# Patient Record
Sex: Female | Born: 1960 | Race: White | Hispanic: No | Marital: Married | Smoking: Never smoker
Health system: Southern US, Community
[De-identification: ages and names within clinical notes are randomized; demographics above are authoritative.]

## PROBLEM LIST (undated history)

## (undated) DIAGNOSIS — F419 Anxiety disorder, unspecified: Secondary | ICD-10-CM

## (undated) DIAGNOSIS — F329 Major depressive disorder, single episode, unspecified: Secondary | ICD-10-CM

## (undated) DIAGNOSIS — E079 Disorder of thyroid, unspecified: Secondary | ICD-10-CM

---

## 2020-02-25 ENCOUNTER — Encounter (HOSPITAL_COMMUNITY): Payer: Self-pay

## 2020-02-25 ENCOUNTER — Ambulatory Visit (HOSPITAL_COMMUNITY)
Admission: EM | Admit: 2020-02-25 | Discharge: 2020-02-25 | Disposition: A | Discharge status: Home / Self Care | Payer: Managed Care, Other (non HMO)

## 2020-02-25 ENCOUNTER — Other Ambulatory Visit: Payer: Self-pay

## 2020-02-25 DIAGNOSIS — U071 COVID-19: Secondary | ICD-10-CM | POA: Diagnosis not present

## 2020-02-25 DIAGNOSIS — R5383 Other fatigue: Secondary | ICD-10-CM

## 2020-02-25 DIAGNOSIS — R0789 Other chest pain: Secondary | ICD-10-CM

## 2020-02-25 HISTORY — DX: Major depressive disorder, single episode, unspecified: F32.9

## 2020-02-25 HISTORY — DX: Disorder of thyroid, unspecified: E07.9

## 2020-02-25 HISTORY — DX: Anxiety disorder, unspecified: F41.9

## 2020-02-25 MED ORDER — BENZONATATE 100 MG PO CAPS: 100.0000 mg | ORAL_CAPSULE | Freq: Three times a day (TID) | ORAL | 0 refills | Status: AC | PRN

## 2020-02-25 MED ORDER — PROMETHAZINE-DM 6.25-15 MG/5ML PO SYRP: 5.0000 mL | ORAL_SOLUTION | Freq: Every evening | ORAL | 0 refills | Status: AC | PRN

## 2020-02-25 MED ORDER — CETIRIZINE HCL 10 MG PO TABS: 10.0000 mg | ORAL_TABLET | Freq: Every day | ORAL | 0 refills | Status: AC

## 2020-02-25 MED ORDER — PSEUDOEPHEDRINE HCL 30 MG PO TABS: 30.0000 mg | ORAL_TABLET | Freq: Three times a day (TID) | ORAL | 0 refills | Status: AC | PRN

## 2020-02-25 NOTE — ED Triage Notes (Signed)
Pt c/o non-productive cough, sore throat onset Wednesday-now reports resolved. States she tested positive for COVID yesterday. Now reports fatigue, mild diarrhea, mild lightheadedness, runny nose, chills, nausea. Pt visiting the area and is requesting referral to COVID tx/infusion clinic. Denies SOB, vomiting.

## 2020-02-25 NOTE — ED Provider Notes (Signed)
[facility]   MRN: [id number] DOB: 11/02/1960  Subjective:   Dawn Rosario is a 59 y.o. female presenting for 2-day history of acute onset productive cough, sore throat, fatigue, mild diarrhea, mild lightheadedness, runny nose, chills, nausea.  Today, patient states that her primary symptom is fatigue and chest heaviness.  She is from [place], her doctor advised to take supportive medications.  [facility] and tested positive for COVID-19, can be seen on care everywhere.  No current facility-administered medications for this encounter.  Current Outpatient Medications:  .  DULoxetine (CYMBALTA) 30 MG capsule, Take 30 mg by mouth daily., Disp: , Rfl:  .  DULoxetine (CYMBALTA) 60 MG capsule, Take by mouth., Disp: , Rfl:  .  gabapentin (NEURONTIN) 300 MG capsule, Take 300 mg by mouth 3 (three) times daily., Disp: , Rfl:  .  levothyroxine (SYNTHROID) 75 MCG tablet, , Disp: , Rfl:  .  promethazine (PHENERGAN) 12.5 MG tablet, Take by mouth., Disp: , Rfl:  .  traZODone (DESYREL) 50 MG tablet, Take by mouth., Disp: , Rfl:  .  ondansetron (ZOFRAN-ODT) 4 MG disintegrating tablet, Take by mouth., Disp: , Rfl:  .  SYNTHROID 100 MCG tablet, Take 100 mcg by mouth daily., Disp: , Rfl:    Allergies  Allergen Reactions  . Sulfa Antibiotics Diarrhea and Other (See Comments)    Mood changes per patient      Past Medical History:  Diagnosis Date  . Anxiety   . Major depressive disorder   . Thyroid disease      History reviewed. No pertinent surgical history.  Family History  Problem Relation Age of Onset  . Cancer Mother   . Cancer Father     Social History   Tobacco Use  . Smoking status: Never Smoker  . Smokeless tobacco: Never Used  Vaping Use  . Vaping Use: Never used  Substance Use Topics  . Alcohol use: Yes  . Drug use: Never    ROS   Objective:   Vitals: BP 134/88 (BP Location: Right Arm)   Pulse 92   Temp 98.7 F (37.1 C)  (Oral)   Resp 18   SpO2 98%   Physical Exam Constitutional:      General: She is not in acute distress.    Appearance: Normal appearance. She is well-developed. She is not ill-appearing, toxic-appearing or diaphoretic.  HENT:     Head: Normocephalic and atraumatic.     Nose: Nose normal.     Mouth/Throat:     Mouth: Mucous membranes are moist.  Eyes:     Extraocular Movements: Extraocular movements intact.     Pupils: Pupils are equal, round, and reactive to light.  Cardiovascular:     Rate and Rhythm: Normal rate and regular rhythm.     Pulses: Normal pulses.     Heart sounds: Normal heart sounds. No murmur heard.  No friction rub. No gallop.   Pulmonary:     Effort: Pulmonary effort is normal. No respiratory distress.     Breath sounds: Normal breath sounds. No stridor. No wheezing, rhonchi or rales.  Skin:    General: Skin is warm and dry.     Findings: No rash.  Neurological:     Mental Status: She is alert and oriented to person, place, and time.  Psychiatric:        Mood and Affect: Mood normal.        Behavior: Behavior normal.  Thought Content: Thought content normal.      Assessment and Plan :   PDMP not reviewed this encounter.  1. COVID-19   2. Fatigue, unspecified type   3. Chest heaviness     Recommended supportive care for COVID-19.  Patient requested referral to the infusion clinic and I placed this through the [facility] [employer] [facility] secure chat.  Also provided her with information to the clinic itself. Counseled patient on potential for adverse effects with medications prescribed/recommended today, ER and return-to-clinic precautions discussed, patient verbalized understanding.    Wallis Bamberg, PA-C 02/25/20 1810

## 2020-02-25 NOTE — Discharge Instructions (Addendum)
Outpatient Infusion Center hotline number, [phone] [address], [place], [place] [address]

## 2020-02-26 ENCOUNTER — Telehealth: Payer: Self-pay | Admitting: Nurse Practitioner

## 2020-02-26 ENCOUNTER — Telehealth: Payer: Self-pay | Admitting: Physician Assistant

## 2020-02-26 ENCOUNTER — Other Ambulatory Visit: Payer: Self-pay | Admitting: Physician Assistant

## 2020-02-26 NOTE — Telephone Encounter (Signed)
Called to discuss with Fatima Sanger about Covid symptoms and the use of casirivimab/imdevimab, a monoclonal antibody infusion for those with mild to moderate Covid symptoms and at a high risk of hospitalization.    Pt is not qualified for this infusion due to lack of identified risk factors and co-morbid conditions.  Symptoms reviewed as well as criteria for ending isolation.  Symptoms reviewed that would warrant ED/Hospital evaluation as well should her condition worsen. Preventative practices reviewed. Provided COVID Care Clinic phone number. Patient verbalized understanding.  Wynne Dust, NP

## 2020-02-26 NOTE — Progress Notes (Signed)
error 

## 2020-02-26 NOTE — Telephone Encounter (Signed)
error 

## 2020-02-28 ENCOUNTER — Other Ambulatory Visit: Payer: Self-pay

## 2020-02-28 ENCOUNTER — Emergency Department (HOSPITAL_COMMUNITY)
Admission: EM | Admit: 2020-02-28 | Discharge: 2020-02-28 | Disposition: A | Discharge status: Home / Self Care | Payer: Managed Care, Other (non HMO) | Attending: Emergency Medicine | Admitting: Emergency Medicine

## 2020-02-28 ENCOUNTER — Encounter (HOSPITAL_COMMUNITY): Payer: Self-pay

## 2020-02-28 ENCOUNTER — Emergency Department (HOSPITAL_COMMUNITY): Payer: Managed Care, Other (non HMO)

## 2020-02-28 DIAGNOSIS — R002 Palpitations: Secondary | ICD-10-CM | POA: Diagnosis not present

## 2020-02-28 DIAGNOSIS — E039 Hypothyroidism, unspecified: Secondary | ICD-10-CM | POA: Insufficient documentation

## 2020-02-28 DIAGNOSIS — U071 COVID-19: Secondary | ICD-10-CM | POA: Diagnosis not present

## 2020-02-28 DIAGNOSIS — R5383 Other fatigue: Secondary | ICD-10-CM | POA: Diagnosis present

## 2020-02-28 DIAGNOSIS — Z79890 Hormone replacement therapy (postmenopausal): Secondary | ICD-10-CM | POA: Insufficient documentation

## 2020-02-28 LAB — BASIC METABOLIC PANEL
Anion gap: 14 (ref 5–15)
BUN: 6 mg/dL (ref 6–20)
CO2: 28 mmol/L (ref 22–32)
Calcium: 9.4 mg/dL (ref 8.9–10.3)
Chloride: 97 mmol/L — ABNORMAL LOW (ref 98–111)
Creatinine, Ser: 0.62 mg/dL (ref 0.44–1.00)
GFR calc Af Amer: >60 mL/min (ref >60)
GFR calc non Af Amer: >60 mL/min (ref >60)
Glucose, Bld: 101 mg/dL — ABNORMAL HIGH (ref 70–99)
Potassium: 4.8 mmol/L (ref 3.5–5.1)
Sodium: 139 mmol/L (ref 135–145)

## 2020-02-28 LAB — CBC
HCT: 37.1 % (ref 36.0–46.0)
Hemoglobin: 12.1 g/dL (ref 12.0–15.0)
MCH: 29.3 pg (ref 26.0–34.0)
MCHC: 32.6 g/dL (ref 30.0–36.0)
MCV: 89.8 fL (ref 80.0–100.0)
Platelets: 130 10*3/uL — ABNORMAL LOW (ref 150–400)
RBC: 4.13 MIL/uL (ref 3.87–5.11)
RDW: 13.8 % (ref 11.5–15.5)
WBC: 2.3 10*3/uL — ABNORMAL LOW (ref 4.0–10.5)
nRBC: 0 % (ref 0.0–0.2)

## 2020-02-28 LAB — TROPONIN I (HIGH SENSITIVITY)
Troponin I (High Sensitivity): 2 ng/L (ref <18)
Troponin I (High Sensitivity): 2 ng/L (ref <18)

## 2020-02-28 LAB — TSH: TSH: 2.76 u[IU]/mL (ref 0.350–4.500)

## 2020-02-28 LAB — MAGNESIUM: Magnesium: 2.2 mg/dL (ref 1.7–2.4)

## 2020-02-28 NOTE — ED Provider Notes (Signed)
[facility]-EMERGENCY DEPT Provider Note   CSN: [id number] Arrival date & time: 02/28/20  1149     History Chief Complaint  Patient presents with  . Covid Positive  . Palpitations  . Fatigue    Dawn Rosario is a 59 y.o. female.  Dawn Rosario is a 59 year old woman with a PMH of major depression and hypothyroidism who presents with a chief complaint of heart palpitations and fatigue with covid positive test on 09/09. Patient's symptoms started on 09/08 including fatigue, chills, headache, productive cough, and nasal congestion. She states that her symptoms have been waxing and waning and her symptoms are worse at different times during the day. She is now experiencing palpitations and chest heaviness that is worst when she is fatigued. She rates the chest heaviness as a 4/10 at its worst and pinpoints it to the substernal area. She denies SOB or hemoptysis. She states that she has had chest palpitations on and off for the last year but has never been evaluated for it. She also reports that this chest heaviness and palpitations always occur when she has any kind of illness like the flu or a cold. She is taking Vit C, Vit D, zinc, and saline nasal spray for supportive care. She is tolerating liquids and solids but does not have much of an appetite. She denies current nausea, vomiting, abdominal pain, or diarrhea.  Patient did not receive her Covid vaccine.  She was evaluated over the phone for monoclonal antibody infusion after receiving her positive results, but does not meet criteria.        Past Medical History:  Diagnosis Date  . Anxiety   . Major depressive disorder   . Thyroid disease     There are no problems to display for this patient.   History reviewed. No pertinent surgical history.   OB History   No obstetric history on file.     Family History  Problem Relation Age of Onset  . Cancer Mother   . Cancer Father     Social History    Tobacco Use  . Smoking status: Never Smoker  . Smokeless tobacco: Never Used  Vaping Use  . Vaping Use: Never used  Substance Use Topics  . Alcohol use: Yes  . Drug use: Never    Home Medications Prior to Admission medications   Medication Sig Start Date End Date Taking? Authorizing Provider  benzonatate (TESSALON) 100 MG capsule Take 1-2 capsules (100-200 mg total) by mouth 3 (three) times daily as needed. 02/25/20   Wallis Bamberg, PA-C  cetirizine (ZYRTEC ALLERGY) 10 MG tablet Take 1 tablet (10 mg total) by mouth daily. 02/25/20   Wallis Bamberg, PA-C  DULoxetine (CYMBALTA) 30 MG capsule Take 30 mg by mouth daily. 12/30/19   [provider]  DULoxetine (CYMBALTA) 60 MG capsule Take by mouth. 12/30/19   [provider]  gabapentin (NEURONTIN) 300 MG capsule Take 300 mg by mouth 3 (three) times daily. 01/25/20   [provider]  levothyroxine (SYNTHROID) 75 MCG tablet  12/22/19   [provider]  ondansetron (ZOFRAN-ODT) 4 MG disintegrating tablet Take by mouth.    [provider]  promethazine (PHENERGAN) 12.5 MG tablet Take by mouth. 02/25/20 02/29/20  [provider]  promethazine-dextromethorphan (PROMETHAZINE-DM) 6.25-15 MG/5ML syrup Take 5 mLs by mouth at bedtime as needed for cough. 02/25/20   Wallis Bamberg, PA-C  pseudoephedrine (SUDAFED) 30 MG tablet Take 1 tablet (30 mg total) by mouth every 8 (  eight) hours as needed for congestion. 02/25/20   Wallis BambergMani, Mario, PA-C  SYNTHROID 100 MCG tablet Take 100 mcg by mouth daily. 09/13/19   [provider]  traZODone (DESYREL) 50 MG tablet Take by mouth. 12/30/19   [provider]    Allergies    Sulfa antibiotics  Review of Systems   Review of Systems  Constitutional: Positive for appetite change, chills and fatigue. Negative for fever.  HENT: Positive for congestion.   Respiratory: Positive for cough and chest tightness. Negative for shortness of breath.   Cardiovascular:  Positive for chest pain and palpitations. Negative for leg swelling.  Gastrointestinal: Negative for abdominal pain, nausea and vomiting.  Genitourinary: Negative for dysuria.  Musculoskeletal: Negative for arthralgias and myalgias.  Skin: Negative for color change and rash.  Neurological: Positive for headaches.  All other systems reviewed and are negative.   Physical Exam Updated Vital Signs BP 121/84 (BP Location: Left Arm)   Pulse 94   Temp 99.2 F (37.3 C) (Oral)   Resp 16   Ht 5' 2.5" (1.588 m)   Wt 49.4 kg   SpO2 97%   BMI 19.62 kg/m   Physical Exam Vitals and nursing note reviewed.  Constitutional:      General: She is not in acute distress.    Appearance: Normal appearance. She is well-developed and normal weight. She is not ill-appearing or diaphoretic.  HENT:     Head: Normocephalic and atraumatic.     Mouth/Throat:     Mouth: Mucous membranes are moist.     Pharynx: Oropharynx is clear.  Eyes:     General:        Right eye: No discharge.        Left eye: No discharge.     Pupils: Pupils are equal, round, and reactive to light.  Cardiovascular:     Rate and Rhythm: Normal rate and regular rhythm.     Heart sounds: Normal heart sounds.     Comments: Normal rate and regular rhythm, normal S1 and S2 present, no audible PVCs, distal pulses intact and equal bilaterally Pulmonary:     Effort: Pulmonary effort is normal. No respiratory distress.     Breath sounds: Normal breath sounds. No wheezing or rales.     Comments: Respirations equal and unlabored, patient able to speak in full sentences, lungs clear to auscultation bilaterally Abdominal:     General: Bowel sounds are normal. There is no distension.     Palpations: Abdomen is soft. There is no mass.     Tenderness: There is no abdominal tenderness. There is no guarding.     Comments: Abdomen soft, nondistended, nontender to palpation in all quadrants without guarding or peritoneal signs  Musculoskeletal:         General: No deformity.     Cervical back: Neck supple.     Right lower leg: No edema.     Left lower leg: No edema.  Skin:    General: Skin is warm and dry.     Capillary Refill: Capillary refill takes less than 2 seconds.  Neurological:     Mental Status: She is alert.     Coordination: Coordination normal.     Comments: Speech is clear, able to follow commands Moves extremities without ataxia, coordination intact  Psychiatric:        Mood and Affect: Mood normal.        Behavior: Behavior normal.     ED Results / Procedures / Treatments  Labs (all labs ordered are listed, but only abnormal results are displayed) Labs Reviewed  BASIC METABOLIC PANEL - Abnormal; Notable for the following components:      Result Value   Chloride 97 (*)    Glucose, Bld 101 (*)    All other components within normal limits  CBC - Abnormal; Notable for the following components:   WBC 2.3 (*)    Platelets 130 (*)    All other components within normal limits  MAGNESIUM  TSH  TROPONIN I (HIGH SENSITIVITY)  TROPONIN I (HIGH SENSITIVITY)    EKG ED ECG REPORT   Date: 02/28/2020  Rate: 85  Rhythm: normal sinus rhythm  QRS Axis: normal  Intervals: normal  ST/T Wave abnormalities: normal  Conduction Disutrbances:none  Narrative Interpretation: Bi-atrial enlargement, borderline right axis deviation  Old EKG Reviewed: none available  I have personally reviewed the EKG tracing and agree with the computerized printout as noted.   Radiology DG Chest 2 View  Result Date: 02/28/2020 CLINICAL DATA:  Palpitations EXAM: CHEST - 2 VIEW COMPARISON:  None. FINDINGS: COPD with pulmonary hyperinflation. Lungs are clear without infiltrate or effusion. Negative for heart failure or mass lesion. Mild apical scarring bilaterally. IMPRESSION: COPD without acute abnormality. Electronically Signed   By: Marlan Palau M.D.   On: 02/28/2020 13:47    Procedures Procedures (including critical care  time)  Medications Ordered in ED Medications - No data to display  ED Course  I have reviewed the triage vital signs and the nursing notes.  Pertinent labs & imaging results that were available during my care of the patient were reviewed by me and considered in my medical decision making (see chart for details).    MDM Rules/Calculators/A&P                         59 year old female presents with palpitations and chest pressure, diagnosed with Covid on 9/9.  History of similar chest pressure in the setting of illness but she has never sought evaluation for it.  Has had palpitations intermittently which she previously associated with anxiety.  Initially went to urgent care in [place], but was instructed to present to the ED for further evaluation.  On arrival she is well-appearing with normal vitals.  States that her Covid symptoms have been waxing and waning, currently she feels improved.  She states that her palpitations and chest pressure are worse that she feels more ill and fatigued.  She denies current shortness of breath and has not been hypoxic.  Has had an intermittent cough.  Initial work-up initiated with basic labs, troponin, EKG and chest x-ray.  EKG shows normal sinus rhythm with no arrhythmia or premature beats. CBC: Leukopenia as often seen with Covid infection, normal hemoglobin BMP: No significant electrolyte derangements, normal renal function Troponin: Initial troponin negative.  Chest x-ray with evidence of pulmonary hyperinflation, patient with no history of COPD or smoking, mild apical scarring bilaterally but no other acute abnormalities.  Will check delta troponin, given palpitations and history of thyroid disease we will also check TSH and magnesium level.  Mag and TSH are normal, delta troponin is nonelevated.  Given very reassuring work-up will have patient follow-up outpatient with cardiology and her PCP but do not feel that further emergent work-up is  indicated.  She has maintained normal vitals while here in the ED and is stable for discharge home with continued supportive treatment of Covid.  Return precautions discussed.  Final Clinical Impression(s) / ED Diagnoses Final diagnoses:  COVID-19 virus infection  Palpitations    Rx / DC Orders ED Discharge Orders    None       Legrand Rams 02/28/20 2151    Arby Barrette, MD 03/01/20 1710

## 2020-02-28 NOTE — ED Triage Notes (Signed)
Patient reports that she tested Covid + on 02/24/20. Patient now c/o fatigue and heart palpitations.

## 2020-02-28 NOTE — Discharge Instructions (Signed)
Your evaluation today has been reassuring.  Your electrolytes and heart enzymes are normal, your thyroid function looks normal as well.  Your EKG and chest x-ray did not show anything concerning.  Your Covid symptoms continue to be mild to monitor symptoms closely and return for any worsening.  I do think given that you have been having palpitations and chest pressure intermittently that you should follow-up closely with cardiology for further evaluation.

## 2021-11-10 IMAGING — CR DG CHEST 2V
2 series · 2 of 2 positions shown · non-contrast
Comparison: None.

CLINICAL DATA: Palpitations

EXAM:
CHEST - 2 VIEW

[w chest pa]
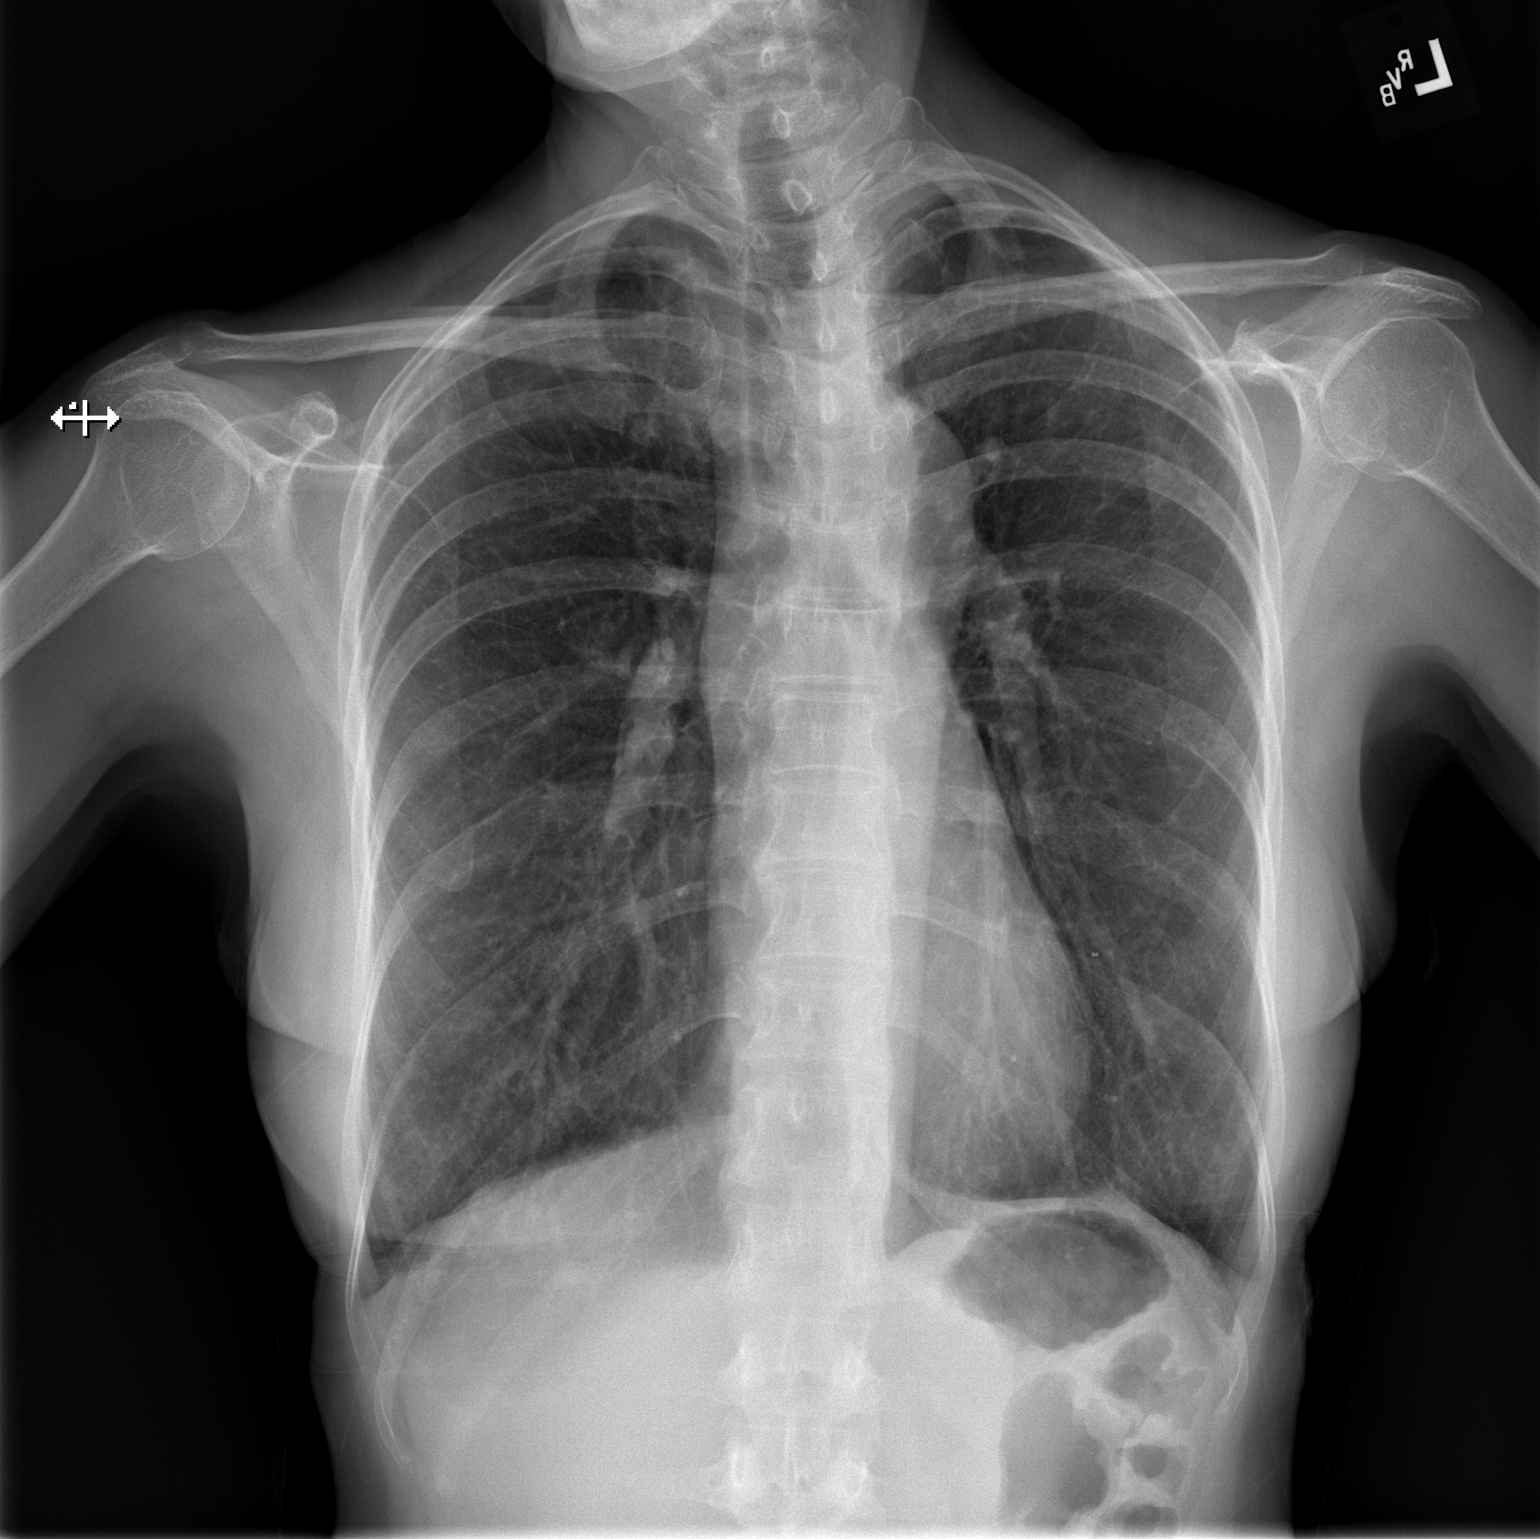

[w chest lat]
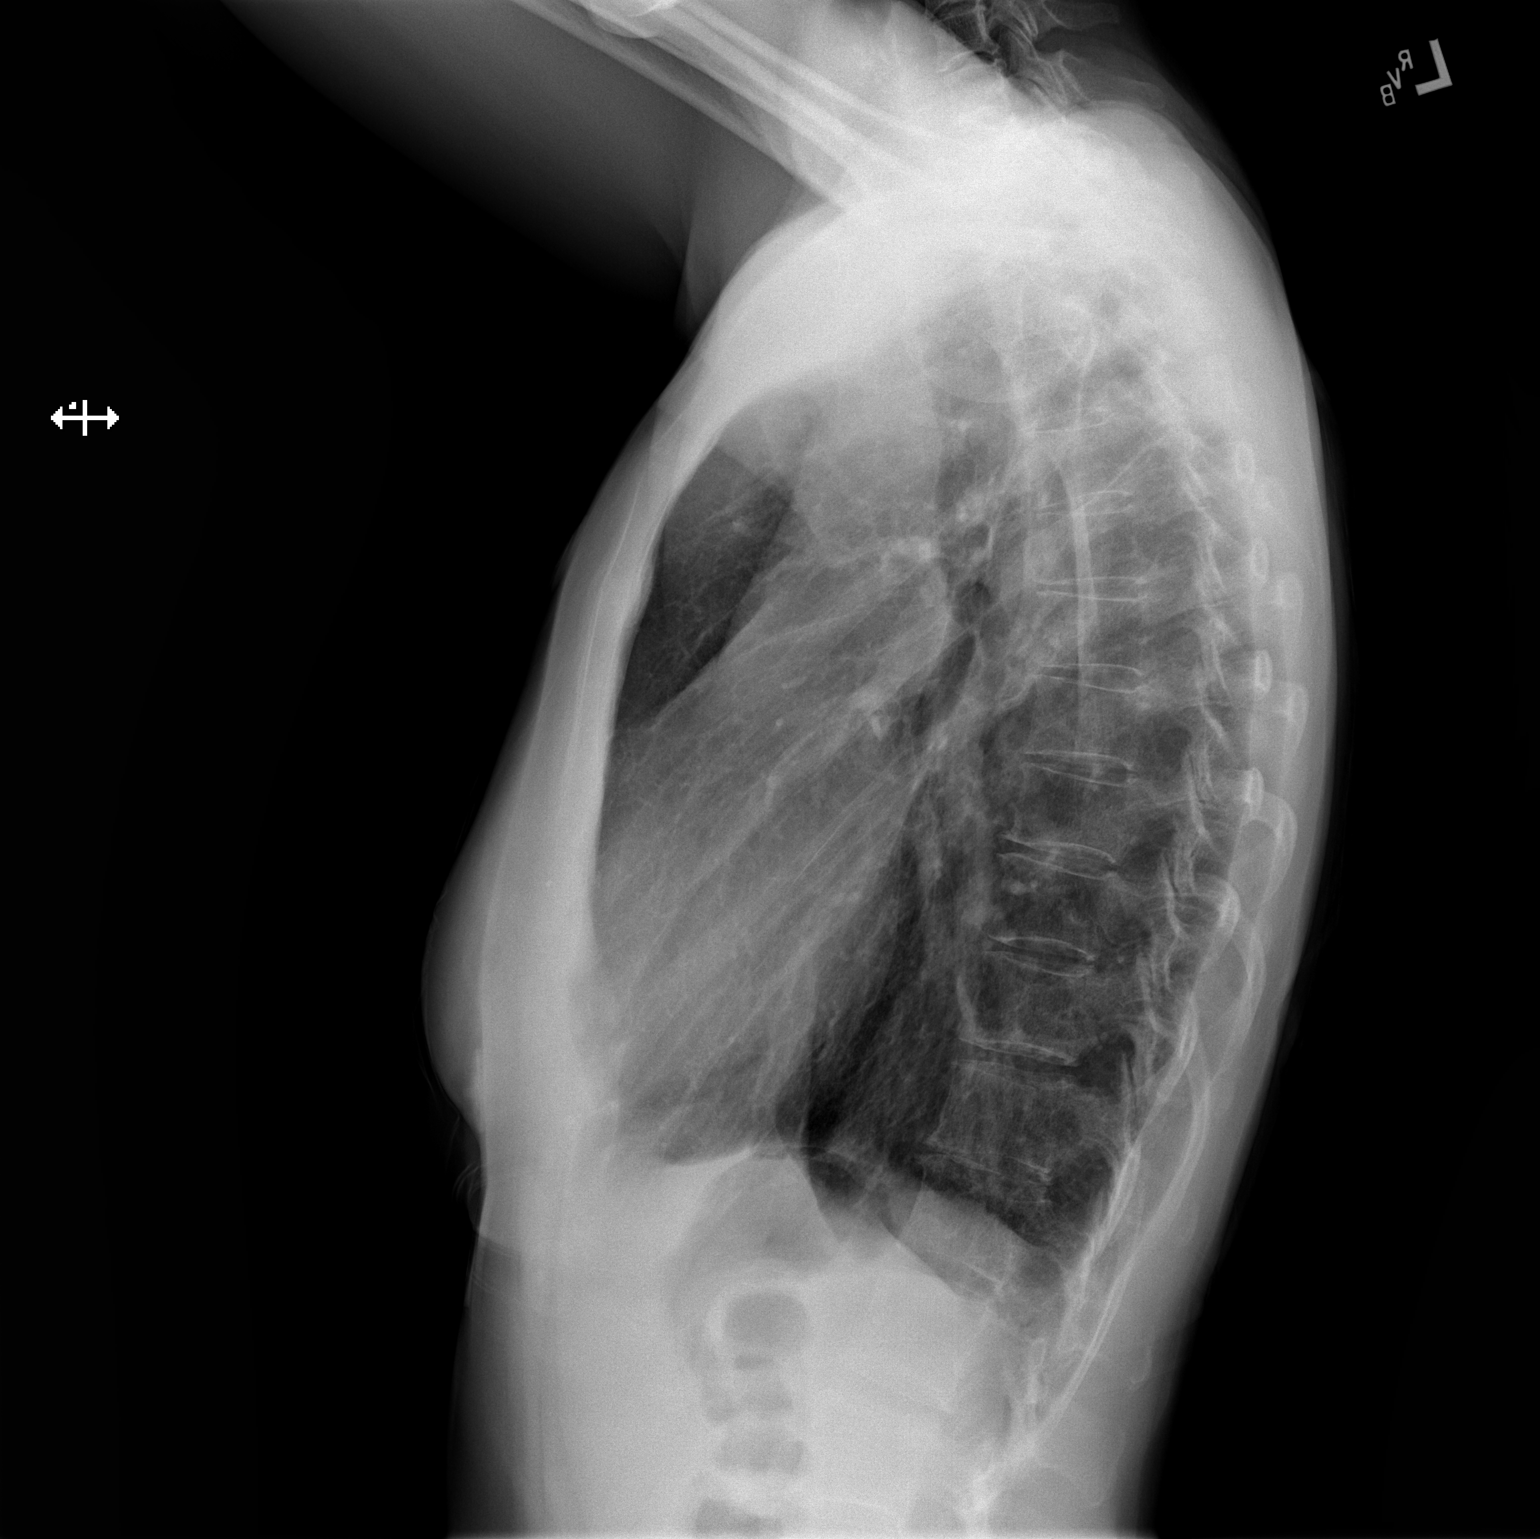

[2 of 2 positions shown; findings below may reference images not displayed]

FINDINGS: COPD with pulmonary hyperinflation. Lungs are clear without
infiltrate or effusion. Negative for heart failure or mass lesion.
Mild apical scarring bilaterally.
IMPRESSION: COPD without acute abnormality.
